# Patient Record
Sex: Male | Born: 2000 | Race: Black or African American | Hispanic: No | Marital: Single | Smoking: Current every day smoker
Health system: Southern US, Community
[De-identification: ages and names within clinical notes are randomized; demographics above are authoritative.]

---
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
## 2021-05-16 IMAGING — DX DG HAND COMPLETE 3+V*R*
3 series · 3 of 3 positions shown · non-contrast
Comparison: None.

CLINICAL DATA: Right hand injury

EXAM:
RIGHT HAND - COMPLETE 3+ VIEW

[hand ap]
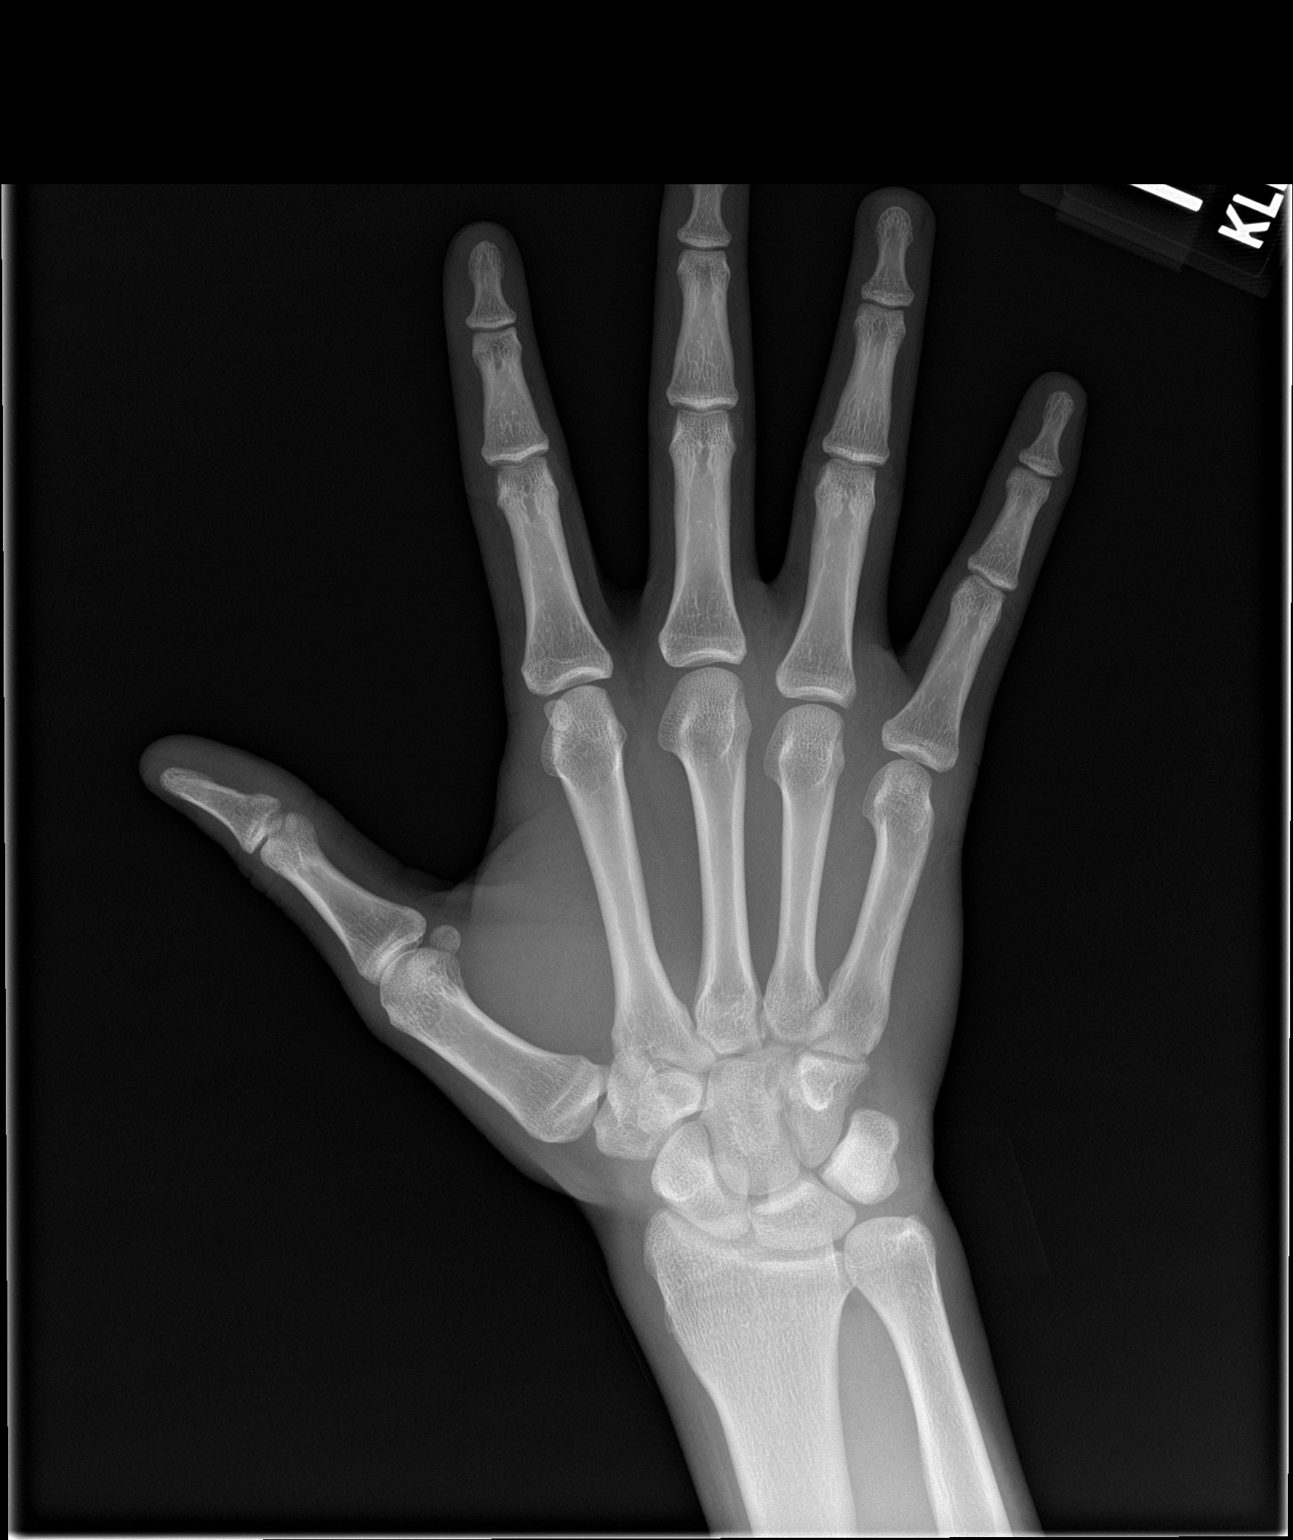

[hand obl]
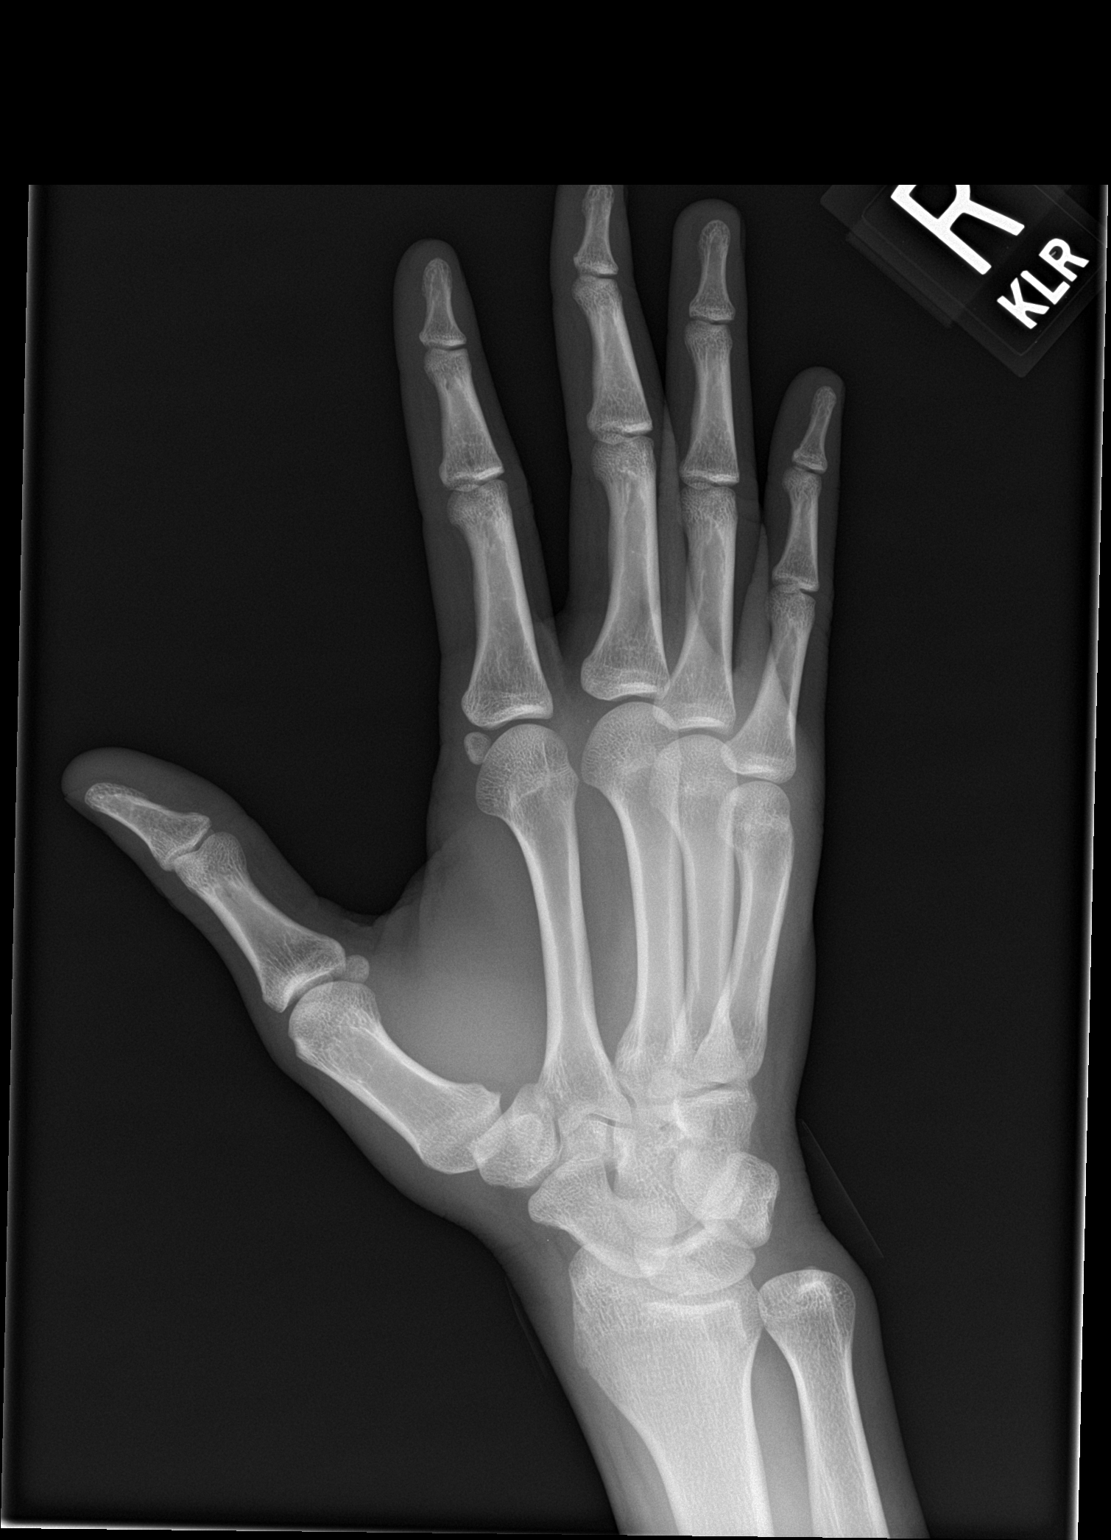

[hand lat]
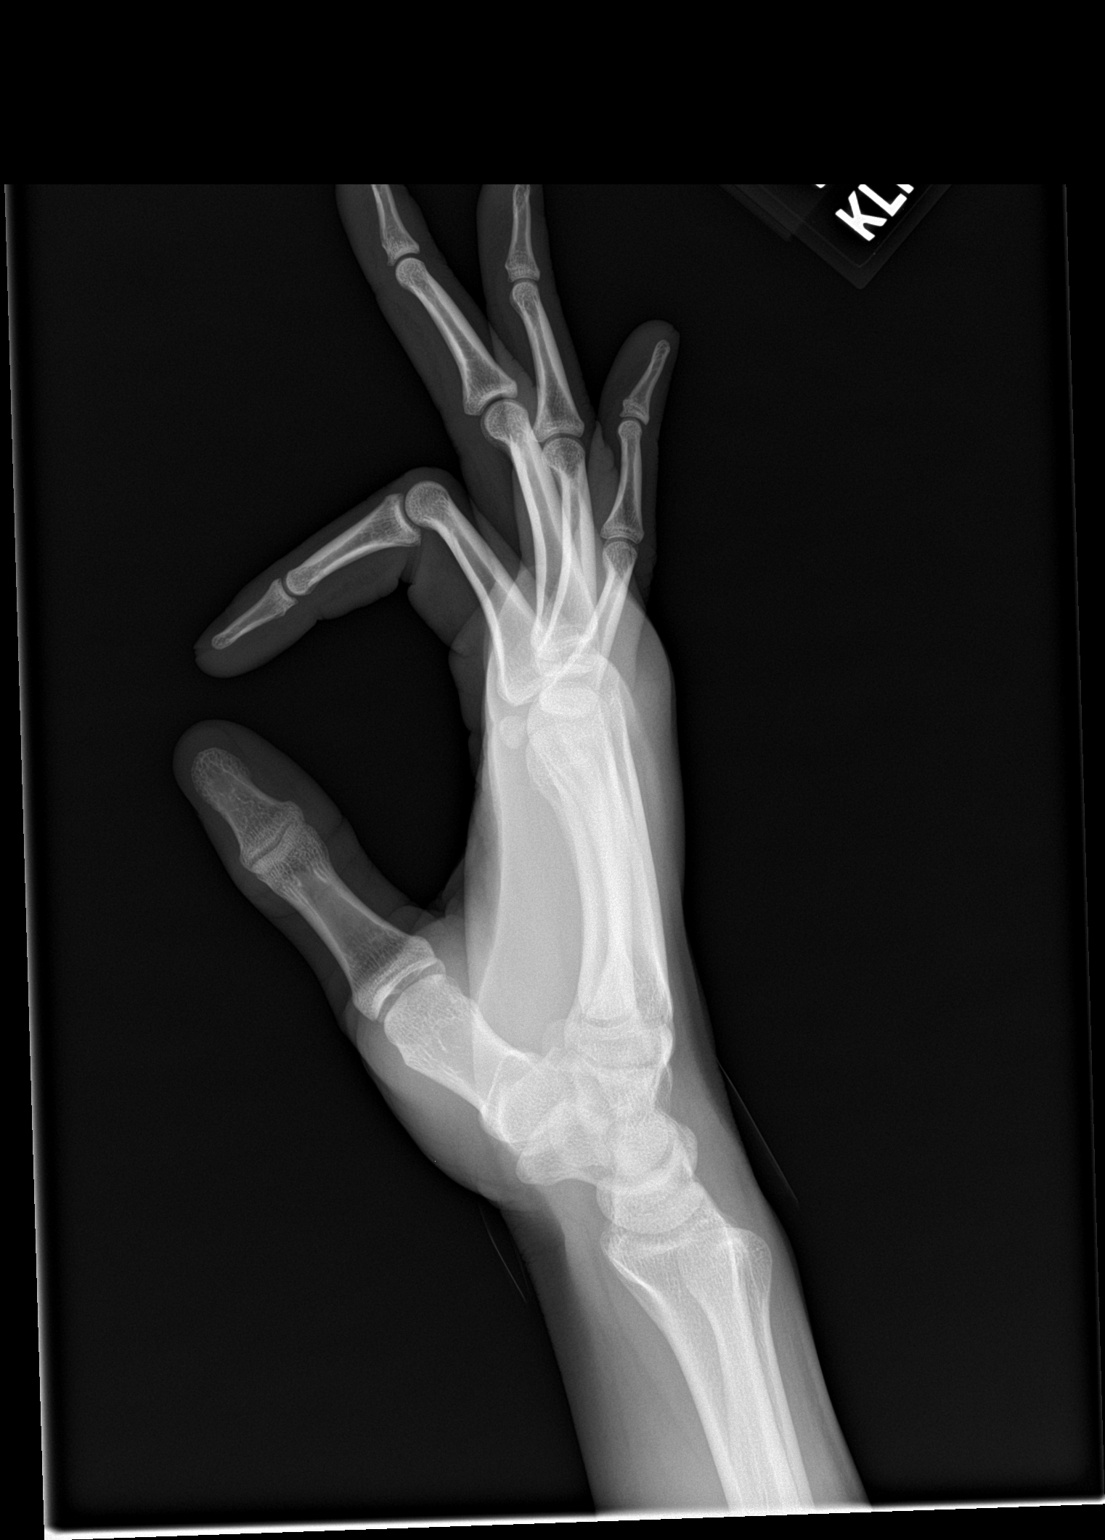

[3 of 3 positions shown; findings below may reference images not displayed]

FINDINGS: There is no evidence of fracture or dislocation. There is no
evidence of arthropathy or other focal bone abnormality. Soft
tissues are unremarkable.
IMPRESSION: Negative.

## 2022-09-21 ENCOUNTER — Other Ambulatory Visit
Admission: RE | Admit: 2022-09-21 | Discharge: 2022-09-21 | Disposition: A | Discharge status: Home / Self Care | Attending: Emergency Medicine | Admitting: Emergency Medicine

## 2022-09-21 NOTE — SANE Note (Signed)
  SUSPECT KIT NOTE:  AGENCY:  [facility] Office  CASE NUMBER:  [id number]  ARRIVAL:  At 1525pm, Mr. Ian Torres, who was dressed in an orange shirt and orange pants labeled [facility], and hands cuffed around his waist, arrived in the custody of  Detective Sparks and Investigator Clare Charon to the Forensic Nursing exam room.  I introduced myself and explained that the accompanying search warrant is asking for 50 head and 50 pubic hairs.  Mr. Elenes responded with, "what if I don't want to do it".  Investigator Salomon Mast explained that with the search warrant, hair would be collected today.    STAFF PRESENT DURING EVIDENCE COLLECTION:   S. Tico Crotteau RN, FNE;   Detective Sparks of [employer];  Investigator Toy Cookey of [facility].    ITEMS COLLECTED PER SEARCH WARRANT / AFFIDAVIT:  50 Head Hairs and 50 Pubic Hairs.  Head and pubic hairs were hand pulled by multiples.  Pt tolerated well.  Hairs were placed in appropriately marked evidence collection envelopes and sealed with evidence tape in the presence of Mr. Cocchiola and law enforcement.  The two envelopes where then placed in a brown letter size envelope and sealed with evidence tape also in the presence of Mr. Hume and law enforcement.  At 1550pm, [employer] signed for the sealed evidence collection kit, which was then turned over to him.
